# Patient Record
Sex: Female | Born: 1985 | Race: Black or African American | Hispanic: No | Marital: Single | Smoking: Never smoker
Health system: Southern US, Community
[De-identification: ages and names within clinical notes are randomized; demographics above are authoritative.]

## PROBLEM LIST (undated history)

## (undated) DIAGNOSIS — Z789 Other specified health status: Secondary | ICD-10-CM

## (undated) HISTORY — DX: Other specified health status: Z78.9

---
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
## 2011-09-19 HISTORY — PX: LEEP: SHX91

## 2015-10-01 ENCOUNTER — Encounter: Payer: Self-pay | Admitting: Obstetrics

## 2015-10-01 ENCOUNTER — Ambulatory Visit (INDEPENDENT_AMBULATORY_CARE_PROVIDER_SITE_OTHER): Payer: BLUE CROSS/BLUE SHIELD | Admitting: Obstetrics

## 2015-10-01 VITALS — BP 116/72 | HR 72 | Temp 98.7°F | Ht 66.0 in | Wt 136.0 lb

## 2015-10-01 DIAGNOSIS — Z3481 Encounter for supervision of other normal pregnancy, first trimester: Secondary | ICD-10-CM

## 2015-10-01 LAB — POCT URINALYSIS DIPSTICK
BILIRUBIN UA: NEGATIVE
Glucose, UA: NEGATIVE
KETONES UA: NEGATIVE
Leukocytes, UA: NEGATIVE
NITRITE UA: NEGATIVE
PH UA: 8
Protein, UA: NEGATIVE
RBC UA: NEGATIVE
SPEC GRAV UA: 1.01
Urobilinogen, UA: NEGATIVE

## 2015-10-01 LAB — TSH: TSH: 0.931 u[IU]/mL (ref 0.350–4.500)

## 2015-10-01 NOTE — Progress Notes (Signed)
Subjective:    Christie Delgado is being seen today for her first obstetrical visit.  This is a planned pregnancy. She is at 1730w1d gestation. Her obstetrical history is significant for none. Relationship with FOB: significant other, living together. Patient does intend to breast feed. Pregnancy history fully reviewed.  The information documented in the HPI was reviewed and verified.  Menstrual History: OB History    Gravida Para Term Preterm AB TAB SAB Ectopic Multiple Living   3 1 1  1     1        Patient's last menstrual period was 07/15/2015.    Past Medical History  Diagnosis Date  . Medical history non-contributory     Past Surgical History  Procedure Laterality Date  . Knee arthroscopy Right 2003  . Leep N/A 09/2011     (Not in a hospital admission) Allergies  Allergen Reactions  . Sulfa Antibiotics Hives    Social History  Substance Use Topics  . Smoking status: Not on file  . Smokeless tobacco: Not on file  . Alcohol Use: Not on file    History reviewed. No pertinent family history.   Review of Systems Constitutional: negative for weight loss Gastrointestinal: negative for vomiting Genitourinary:negative for genital lesions and vaginal discharge and dysuria Musculoskeletal:negative for back pain Behavioral/Psych: negative for abusive relationship, depression, illegal drug usage and tobacco use    Objective:    BP 116/72 mmHg  Pulse 72  Temp(Src) 98.7 F (37.1 C)  Ht 5\' 6"  (1.676 m)  Wt 136 lb (61.689 kg)  BMI 21.96 kg/m2  LMP 07/15/2015 General Appearance:    Alert, cooperative, no distress, appears stated age  Head:    Normocephalic, without obvious abnormality, atraumatic  Eyes:    PERRL, conjunctiva/corneas clear, EOM's intact, fundi    benign, both eyes  Ears:    Normal TM's and external ear canals, both ears  Nose:   Nares normal, septum midline, mucosa normal, no drainage    or sinus tenderness  Throat:   Lips, mucosa, and tongue normal; teeth  and gums normal  Neck:   Supple, symmetrical, trachea midline, no adenopathy;    thyroid:  no enlargement/tenderness/nodules; no carotid   bruit or JVD  Back:     Symmetric, no curvature, ROM normal, no CVA tenderness  Lungs:     Clear to auscultation bilaterally, respirations unlabored  Chest Wall:    No tenderness or deformity   Heart:    Regular rate and rhythm, S1 and S2 normal, no murmur, rub   or gallop  Breast Exam:    No tenderness, masses, or nipple abnormality  Abdomen:     Soft, non-tender, bowel sounds active all four quadrants,    no masses, no organomegaly  Genitalia:    Normal female without lesion, discharge or tenderness  Extremities:   Extremities normal, atraumatic, no cyanosis or edema  Pulses:   2+ and symmetric all extremities  Skin:   Skin color, texture, turgor normal, no rashes or lesions  Lymph nodes:   Cervical, supraclavicular, and axillary nodes normal  Neurologic:   CNII-XII intact, normal strength, sensation and reflexes    throughout      Lab Review Urine pregnancy test Labs reviewed yes Radiologic studies reviewed yes Assessment:    Pregnancy at 730w1d weeks    Plan:      Prenatal vitamins.  Counseling provided regarding continued use of seat belts, cessation of alcohol consumption, smoking or use of illicit drugs; infection precautions i.e.,  influenza/TDAP immunizations, toxoplasmosis,CMV, parvovirus, listeria and varicella; workplace safety, exercise during pregnancy; routine dental care, safe medications, sexual activity, hot tubs, saunas, pools, travel, caffeine use, fish and methlymercury, potential toxins, hair treatments, varicose veins Weight gain recommendations per IOM guidelines reviewed: underweight/BMI< 18.5--> gain 28 - 40 lbs; normal weight/BMI 18.5 - 24.9--> gain 25 - 35 lbs; overweight/BMI 25 - 29.9--> gain 15 - 25 lbs; obese/BMI >30->gain  11 - 20 lbs Problem list reviewed and updated. FIRST/CF mutation testing/NIPT/QUAD  SCREEN/fragile X/Ashkenazi Jewish population testing/Spinal muscular atrophy discussed: requested. Role of ultrasound in pregnancy discussed; fetal survey: requested. Amniocentesis discussed: not indicated. VBAC calculator score: VBAC consent form provided Meds ordered this encounter  Medications  . Prenatal Vit-Fe Fumarate-FA (PRENATAL VITAMIN) 27-0.8 MG TABS    Sig: Take by mouth.   Orders Placed This Encounter  Procedures  . Culture, OB Urine  . SureSwab, Vaginosis/Vaginitis Plus  . Obstetric panel  . HIV antibody  . Hemoglobinopathy evaluation  . Varicella zoster antibody, IgG  . VITAMIN D 25 Hydroxy (Vit-D Deficiency, Fractures)  . TSH  . POCT urinalysis dipstick    Follow up in 4 weeks.

## 2015-10-02 LAB — OBSTETRIC PANEL
Antibody Screen: NEGATIVE
BASOS ABS: 0 10*3/uL (ref 0.0–0.1)
BASOS PCT: 0 % (ref 0–1)
Eosinophils Absolute: 0.1 10*3/uL (ref 0.0–0.7)
Eosinophils Relative: 2 % (ref 0–5)
HEMATOCRIT: 34.6 % — AB (ref 36.0–46.0)
HEP B S AG: NEGATIVE
Hemoglobin: 11 g/dL — ABNORMAL LOW (ref 12.0–15.0)
LYMPHS PCT: 20 % (ref 12–46)
Lymphs Abs: 1.2 10*3/uL (ref 0.7–4.0)
MCH: 26.4 pg (ref 26.0–34.0)
MCHC: 31.8 g/dL (ref 30.0–36.0)
MCV: 83 fL (ref 78.0–100.0)
MONO ABS: 0.4 10*3/uL (ref 0.1–1.0)
MPV: 11.4 fL (ref 8.6–12.4)
Monocytes Relative: 7 % (ref 3–12)
NEUTROS ABS: 4.2 10*3/uL (ref 1.7–7.7)
NEUTROS PCT: 71 % (ref 43–77)
Platelets: 245 10*3/uL (ref 150–400)
RBC: 4.17 MIL/uL (ref 3.87–5.11)
RDW: 15.8 % — ABNORMAL HIGH (ref 11.5–15.5)
RUBELLA: 1.12 {index} — AB (ref <0.90)
Rh Type: POSITIVE
WBC: 5.9 10*3/uL (ref 4.0–10.5)

## 2015-10-02 LAB — HIV ANTIBODY (ROUTINE TESTING W REFLEX): HIV 1&2 Ab, 4th Generation: NONREACTIVE

## 2015-10-02 LAB — VITAMIN D 25 HYDROXY (VIT D DEFICIENCY, FRACTURES): VIT D 25 HYDROXY: 13 ng/mL — AB (ref 30–100)

## 2015-10-02 LAB — VARICELLA ZOSTER ANTIBODY, IGG: Varicella IgG: 1799 Index — ABNORMAL HIGH (ref <135.00)

## 2015-10-03 LAB — HEMOGLOBINOPATHY EVALUATION
HEMOGLOBIN OTHER: 0 %
HGB A: 97.5 % (ref 96.8–97.8)
Hgb A2 Quant: 2.5 % (ref 2.2–3.2)
Hgb F Quant: 0 % (ref 0.0–2.0)
Hgb S Quant: 0 %

## 2015-10-03 LAB — CULTURE, OB URINE
COLONY COUNT: NO GROWTH
ORGANISM ID, BACTERIA: NO GROWTH

## 2015-10-05 ENCOUNTER — Other Ambulatory Visit: Payer: Self-pay | Admitting: Obstetrics

## 2015-10-05 DIAGNOSIS — B9689 Other specified bacterial agents as the cause of diseases classified elsewhere: Secondary | ICD-10-CM

## 2015-10-05 DIAGNOSIS — N760 Acute vaginitis: Principal | ICD-10-CM

## 2015-10-05 LAB — SURESWAB, VAGINOSIS/VAGINITIS PLUS
ATOPOBIUM VAGINAE: 7.5 Log (cells/mL)
C. albicans, DNA: NOT DETECTED
C. glabrata, DNA: NOT DETECTED
C. parapsilosis, DNA: NOT DETECTED
C. trachomatis RNA, TMA: NOT DETECTED
C. tropicalis, DNA: NOT DETECTED
LACTOBACILLUS SPECIES: NOT DETECTED Log (cells/mL)
N. gonorrhoeae RNA, TMA: NOT DETECTED
T. VAGINALIS RNA, QL TMA: NOT DETECTED

## 2015-10-05 MED ORDER — METRONIDAZOLE 500 MG PO TABS: 500.0000 mg | ORAL_TABLET | Freq: Two times a day (BID) | ORAL | Status: DC

## 2015-10-28 ENCOUNTER — Encounter: Payer: BLUE CROSS/BLUE SHIELD | Admitting: Obstetrics

## 2015-10-31 ENCOUNTER — Ambulatory Visit (INDEPENDENT_AMBULATORY_CARE_PROVIDER_SITE_OTHER): Payer: BLUE CROSS/BLUE SHIELD | Admitting: Obstetrics

## 2015-10-31 ENCOUNTER — Encounter: Payer: Self-pay | Admitting: Obstetrics

## 2015-10-31 VITALS — BP 115/76 | HR 88 | Temp 98.6°F | Wt 141.0 lb

## 2015-10-31 DIAGNOSIS — Z3482 Encounter for supervision of other normal pregnancy, second trimester: Secondary | ICD-10-CM

## 2015-10-31 LAB — POCT URINALYSIS DIPSTICK
BILIRUBIN UA: NEGATIVE
GLUCOSE UA: NEGATIVE
KETONES UA: NEGATIVE
Leukocytes, UA: NEGATIVE
Nitrite, UA: NEGATIVE
Protein, UA: NEGATIVE
RBC UA: NEGATIVE
Urobilinogen, UA: NEGATIVE
pH, UA: 8

## 2015-10-31 NOTE — Progress Notes (Signed)
  Subjective:    Christie GoldsmithRachel Delgado is a 30 y.o. female being seen today for her obstetrical visit. She is at 9947w3d gestation. Patient reports: no complaints.  Problem List Items Addressed This Visit    None    Visit Diagnoses    Supervision of other normal pregnancy, antepartum, second trimester    -  Primary    Relevant Orders    POCT urinalysis dipstick (Completed)    US OB Comp + 14 Wk    AFP, Quad Screen    AFP, Quad Screen      There are no active problems to display for this patient.   Objective:     BP 115/76 mmHg  Pulse 88  Temp(Src) 98.6 F (37 C)  Wt 141 lb (63.957 kg)  LMP 07/15/2015 Uterine Size: Below umbilicus     Assessment:    Pregnancy @ 5647w3d  weeks Doing well    Plan:    Problem list reviewed and updated. Labs reviewed.  Follow up in 4 weeks. FIRST/CF mutation testing/NIPT/QUAD SCREEN/fragile X/Ashkenazi Jewish population testing/Spinal muscular atrophy discussed: requested. Role of ultrasound in pregnancy discussed; fetal survey: requested. Amniocentesis discussed: not indicated.

## 2015-11-01 LAB — AFP, QUAD SCREEN
AFP: 36.2 ng/mL
Age Alone: 1:663 {titer}
Curr Gest Age: 15.3 wks.days
Down Syndrome Scr Risk Est: 1:38500 {titer}
HCG, Total: 20.2 IU/mL
INH: 113.6 pg/mL
Interpretation-AFP: NEGATIVE
MOM FOR HCG: 0.39
MoM for AFP: 0.98
MoM for INH: 0.61
OPEN SPINA BIFIDA: NEGATIVE
TRI 18 SCR RISK EST: NEGATIVE
UE3 VALUE: 0.84 ng/mL
uE3 Mom: 1.16

## 2015-11-06 ENCOUNTER — Other Ambulatory Visit: Payer: Self-pay | Admitting: Certified Nurse Midwife

## 2015-11-28 ENCOUNTER — Ambulatory Visit (INDEPENDENT_AMBULATORY_CARE_PROVIDER_SITE_OTHER): Payer: BLUE CROSS/BLUE SHIELD

## 2015-11-28 ENCOUNTER — Ambulatory Visit (INDEPENDENT_AMBULATORY_CARE_PROVIDER_SITE_OTHER): Payer: BLUE CROSS/BLUE SHIELD | Admitting: Obstetrics

## 2015-11-28 ENCOUNTER — Encounter: Payer: Self-pay | Admitting: Obstetrics

## 2015-11-28 VITALS — BP 115/82 | HR 73 | Temp 98.7°F | Wt 144.0 lb

## 2015-11-28 DIAGNOSIS — Z3482 Encounter for supervision of other normal pregnancy, second trimester: Secondary | ICD-10-CM

## 2015-11-28 DIAGNOSIS — Z36 Encounter for antenatal screening of mother: Secondary | ICD-10-CM

## 2015-11-28 LAB — POCT URINALYSIS DIPSTICK
BILIRUBIN UA: NEGATIVE
Blood, UA: NEGATIVE
GLUCOSE UA: NEGATIVE
KETONES UA: NEGATIVE
Nitrite, UA: NEGATIVE
PH UA: 8.5
Protein, UA: NEGATIVE
Spec Grav, UA: <=1.005
Urobilinogen, UA: NEGATIVE

## 2015-11-28 NOTE — Progress Notes (Signed)
Subjective:    Christie Delgado is a 30 y.o. female being seen today for her obstetrical visit. She is at [redacted]w[redacted]d gestation. Patient reports: no complaints . Fetal movement: normal.  Problem List Items Addressed This Visit    None    Visit Diagnoses    Supervision of other normal pregnancy, antepartum, second trimester    -  Primary    Relevant Orders    POCT urinalysis dipstick    [place] MFM OB COMP + 14 WK      There are no active problems to display for this patient.  Objective:    BP 115/82 mmHg  Pulse 73  Temp(Src) 98.7 F (37.1 C)  Wt 144 lb (65.318 kg)  LMP 07/15/2015 FHT: 150 BPM  Uterine Size: size equals dates     Assessment:    Pregnancy @ [redacted]w[redacted]d    Plan:    Signs and symptoms of preterm labor: discussed.  Labs, problem list reviewed and updated 2 hr GTT planned Follow up in 4 weeks.

## 2015-11-28 NOTE — Addendum Note (Signed)
Addended by: Marya Landry D on: 11/28/2015 01:13 PM   Modules accepted: Orders

## 2015-11-29 ENCOUNTER — Other Ambulatory Visit: Payer: Self-pay | Admitting: Obstetrics

## 2015-11-29 ENCOUNTER — Ambulatory Visit (HOSPITAL_COMMUNITY)
Admission: RE | Admit: 2015-11-29 | Discharge: 2015-11-29 | Disposition: A | Discharge status: Home / Self Care | Payer: BLUE CROSS/BLUE SHIELD | Source: Ambulatory Visit | Attending: Obstetrics | Admitting: Obstetrics

## 2015-11-29 ENCOUNTER — Encounter (HOSPITAL_COMMUNITY): Payer: Self-pay

## 2015-11-29 DIAGNOSIS — O35BXX0 Maternal care for other (suspected) fetal abnormality and damage, fetal cardiac anomalies, not applicable or unspecified: Secondary | ICD-10-CM

## 2015-11-29 DIAGNOSIS — Z3A19 19 weeks gestation of pregnancy: Secondary | ICD-10-CM

## 2015-11-29 DIAGNOSIS — O359XX0 Maternal care for (suspected) fetal abnormality and damage, unspecified, not applicable or unspecified: Secondary | ICD-10-CM

## 2015-11-29 DIAGNOSIS — O358XX0 Maternal care for other (suspected) fetal abnormality and damage, not applicable or unspecified: Secondary | ICD-10-CM | POA: Insufficient documentation

## 2015-11-29 DIAGNOSIS — Z36 Encounter for antenatal screening of mother: Secondary | ICD-10-CM | POA: Insufficient documentation

## 2015-11-29 DIAGNOSIS — Z3689 Encounter for other specified antenatal screening: Secondary | ICD-10-CM

## 2015-11-29 DIAGNOSIS — Z315 Encounter for genetic counseling: Secondary | ICD-10-CM | POA: Diagnosis present

## 2015-11-29 DIAGNOSIS — Z3482 Encounter for supervision of other normal pregnancy, second trimester: Secondary | ICD-10-CM

## 2015-11-29 NOTE — Progress Notes (Signed)
Genetic Counseling  High-Risk Gestation Note  Appointment Date:  11/29/2015 Referred By: Shelly Bombard, MD Date of Birth:  Sep 19, 1986   Pregnancy History: [id number] Estimated Date of Delivery: 04/20/16 Estimated Gestational Age: 56w4dAttending: MRenella Cunas MD  I met with [name] her partner for genetic counseling because of abnormal ultrasound finding.  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  Discussed various etiologies for congenital heart disease including isolated, multifactorial, environmental, and chromosomal/genetic  Regarding chromosome conditions, tetralogy of fallot associated with approximately 45% chance for chromosome condition  Quad screen within normal limits for T21 and T18  Patient declined NIPS (with 22q) today; Couple understandably upset and indicated too overwhelmed to decide about genetic testing today  Declined amniocentesis given associated risk for complications   [facility]  Follow-up ultrasound scheduled for 12/27/15  We began by reviewing the ultrasound in detail. Detailed ultrasound performed today visualized fetal heart defect, suggestive of tetralogy of fallot. Remaining visualized fetal anatomy appeared normal. Complete ultrasound results under separate cover. Follow-up ultrasound is scheduled for 12/27/15. [facility]   This couple was counseled that congenital heart defects occur at a frequency of approximately 1 in 100 births.  Tetralogy of fallot is a congenital heart defect in which there are four components: right ventricular outflow tract obstruction, overriding aorta, ventricular septal defect (VSD), and right ventricular hypertrophy. Tetralogy of fallot is the most common cyanotic congenital heart disease, accounting for  approximately 5-10% of congenital heart disease of the newborn.     We discussed that congenital heart defects can be genetic, environmental, or multifactorial in etiology.  This couple was counseled that congenital heart defects are prominent features in chromosome conditions, particularly autosomal trisomies and microdeletions.  Approximately 15-20% of congenital heart defects result from an underlying chromosome condition, and specifically tetralogy of fallot is associated with an approximate 45% risk for an underlying chromosome condition.    We reviewed chromosomes, nondisjunction, and the common features of Down syndrome, trisomies 13/18, and 22q11 deletion syndrome. It is estimated that 8-23% of patients with tetralogy of fallot have 22q11 deletion syndrome. In addition, single gene conditions are also a common genetic cause of congenital heart defects.  To date, more than 500 single gene disorders are known to have congenital heart disease as a described feature. Tetralogy of fallot has been described as a feature of phenylketonuria, Alagille syndrome, VACTERL, and CHARGE syndrome.    Environmental causes for congenital heart defects are well described and many teratogens are known to be linked to congenital heart disease (alcohol and specific medications).  Additionally, we discussed that the incidence of congenital heart defects is increased among individuals who have insulin dependent diabetes. Ms. JCraveyreported no known teratogenic exposures during the pregnancy. The remainder of the cases of congenital heart disease are classified as multifactorial, referring to the condition being caused by a combination of both environmental and genetic causes.    We reviewed the results of Christie Delgado Quad screening, previously performed through her OB office, which indicated decreased risks for fetal Down syndrome (1 in 38,500) and trisomy 18 (1 in 86. We discussed that screening tests are used to modify  a patient's a priori risk for aneuploidy, typically based on age.  This estimate provides a pregnancy specific risk assessment and is not considered to be diagnostic. This couple was counseled regarding  the availability of noninvasive prenatal screening (NIPS)/cell free DNA testing.  We reviewed that this technology screens for specific fetal aneuploidy (trisomies 13, 18, 21 and Turner syndrome) and 22q11 deletion syndrome.  We reviewed the detection and false positive rates for each condition. NIPS does not assess for all chromosome conditions and does not assess for single gene conditions.  In addition, we reviewed the option of amniocentesis including the associated risks, benefits, and limitations.  She was counseled that chromosome analysis can be performed both prenatally (amniocentesis) and postnatally (cord blood or peripheral blood).  Christie Delgado declined amniocentesis given the associated risk for complications. The couple also declined NIPS at the time of today's visit. They were understandably upset by today's ultrasound findings and stated that they were still processing this information and wanted to have more time to further consider genetic screening/testing options.   We discussed that the postnatal prognosis is somewhat dependent upon the underlying etiology. We reviewed options for the pregnancy including termination of pregnancy, follow-up ultrasounds and fetal echocardiogram and postnatal genetics evaluation. Follow up ultrasound and fetal echocardiogram were recommended and scheduled.   Given the nature of today's discussion, formal family histories were not reviewed at this time. The couple reported no known relatives with birth defects, including congenital heart disease and no known genetic conditions in the family. Without further information regarding the provided family history, an accurate genetic risk cannot be calculated. Further genetic counseling is warranted if more information is  obtained.  I counseled this couple regarding the above risks and available options.  The approximate face-to-face time with the genetic counselor was 30 minutes.  Chipper Oman, MS Certified Genetic Counselor 11/29/2015

## 2015-12-02 ENCOUNTER — Telehealth: Payer: Self-pay | Admitting: *Deleted

## 2015-12-02 ENCOUNTER — Other Ambulatory Visit (HOSPITAL_COMMUNITY): Payer: Self-pay | Admitting: *Deleted

## 2015-12-02 DIAGNOSIS — O359XX0 Maternal care for (suspected) fetal abnormality and damage, unspecified, not applicable or unspecified: Secondary | ICD-10-CM

## 2015-12-02 LAB — SURESWAB, VAGINOSIS/VAGINITIS PLUS
Atopobium vaginae: NOT DETECTED Log (cells/mL)
C. GLABRATA, DNA: NOT DETECTED
C. PARAPSILOSIS, DNA: NOT DETECTED
C. TROPICALIS, DNA: NOT DETECTED
C. albicans, DNA: DETECTED — AB
C. trachomatis RNA, TMA: NOT DETECTED
LACTOBACILLUS SPECIES: NOT DETECTED Log (cells/mL)
MEGASPHAERA SPECIES: NOT DETECTED Log (cells/mL)
N. GONORRHOEAE RNA, TMA: NOT DETECTED
T. vaginalis RNA, QL TMA: NOT DETECTED

## 2015-12-02 NOTE — Telephone Encounter (Signed)
Patient reports she was supposed to have medications at the pharmacy and they are not there.

## 2015-12-03 ENCOUNTER — Other Ambulatory Visit: Payer: Self-pay | Admitting: Obstetrics

## 2015-12-03 DIAGNOSIS — N760 Acute vaginitis: Principal | ICD-10-CM

## 2015-12-03 DIAGNOSIS — B9689 Other specified bacterial agents as the cause of diseases classified elsewhere: Secondary | ICD-10-CM

## 2015-12-03 MED ORDER — TINIDAZOLE 500 MG PO TABS: 1000.0000 mg | ORAL_TABLET | Freq: Every day | ORAL | Status: DC

## 2015-12-26 ENCOUNTER — Ambulatory Visit (INDEPENDENT_AMBULATORY_CARE_PROVIDER_SITE_OTHER): Payer: BLUE CROSS/BLUE SHIELD | Admitting: Obstetrics

## 2015-12-26 ENCOUNTER — Other Ambulatory Visit: Payer: Self-pay

## 2015-12-26 VITALS — BP 107/71 | HR 85 | Temp 99.0°F | Wt 146.0 lb

## 2015-12-26 DIAGNOSIS — J301 Allergic rhinitis due to pollen: Secondary | ICD-10-CM

## 2015-12-26 DIAGNOSIS — J069 Acute upper respiratory infection, unspecified: Secondary | ICD-10-CM | POA: Diagnosis not present

## 2015-12-26 DIAGNOSIS — Z3482 Encounter for supervision of other normal pregnancy, second trimester: Secondary | ICD-10-CM

## 2015-12-26 DIAGNOSIS — Z331 Pregnant state, incidental: Secondary | ICD-10-CM | POA: Diagnosis not present

## 2015-12-26 DIAGNOSIS — Z1389 Encounter for screening for other disorder: Secondary | ICD-10-CM | POA: Diagnosis not present

## 2015-12-26 DIAGNOSIS — O2342 Unspecified infection of urinary tract in pregnancy, second trimester: Secondary | ICD-10-CM

## 2015-12-26 LAB — POCT URINALYSIS DIPSTICK
Bilirubin, UA: NEGATIVE
Blood, UA: 50
GLUCOSE UA: NEGATIVE
Ketones, UA: NEGATIVE
NITRITE UA: NEGATIVE
PROTEIN UA: NEGATIVE
SPEC GRAV UA: 1.015
UROBILINOGEN UA: NEGATIVE
pH, UA: 6

## 2015-12-26 MED ORDER — LORATADINE 10 MG PO TABS: 10.0000 mg | ORAL_TABLET | Freq: Every day | ORAL | Status: DC

## 2015-12-26 MED ORDER — CEFUROXIME AXETIL 500 MG PO TABS: 500.0000 mg | ORAL_TABLET | Freq: Two times a day (BID) | ORAL | Status: DC

## 2015-12-27 ENCOUNTER — Other Ambulatory Visit (HOSPITAL_COMMUNITY): Payer: Self-pay | Admitting: Maternal and Fetal Medicine

## 2015-12-27 ENCOUNTER — Ambulatory Visit (HOSPITAL_COMMUNITY)
Admission: RE | Admit: 2015-12-27 | Discharge: 2015-12-27 | Disposition: A | Discharge status: Home / Self Care | Payer: BLUE CROSS/BLUE SHIELD | Source: Ambulatory Visit | Attending: Obstetrics | Admitting: Obstetrics

## 2015-12-27 ENCOUNTER — Encounter: Payer: Self-pay | Admitting: Obstetrics

## 2015-12-27 ENCOUNTER — Encounter (HOSPITAL_COMMUNITY): Payer: Self-pay

## 2015-12-27 VITALS — BP 111/67 | HR 79 | Wt 146.0 lb

## 2015-12-27 DIAGNOSIS — O359XX0 Maternal care for (suspected) fetal abnormality and damage, unspecified, not applicable or unspecified: Secondary | ICD-10-CM

## 2015-12-27 DIAGNOSIS — Z3A23 23 weeks gestation of pregnancy: Secondary | ICD-10-CM | POA: Diagnosis not present

## 2015-12-27 DIAGNOSIS — O358XX0 Maternal care for other (suspected) fetal abnormality and damage, not applicable or unspecified: Secondary | ICD-10-CM | POA: Diagnosis not present

## 2015-12-27 DIAGNOSIS — O35BXX0 Maternal care for other (suspected) fetal abnormality and damage, fetal cardiac anomalies, not applicable or unspecified: Secondary | ICD-10-CM

## 2015-12-27 LAB — CULTURE, OB URINE

## 2015-12-27 NOTE — Progress Notes (Signed)
  Subjective:    Christie GoldsmithRachel Delgado is a 30 y.o. female being seen today for her obstetrical visit. She is at 322w4d gestation. Patient reports: burning with urination, cough, runny nose and congestion    Fetal movement: normal.  Problem List Items Addressed This Visit    None    Visit Diagnoses    Supervision of other normal pregnancy, antepartum, second trimester    -  Primary    Relevant Orders    POCT urinalysis dipstick (Completed)    UTI (urinary tract infection) during pregnancy, second trimester        Relevant Medications    cefUROXime (CEFTIN) 500 MG tablet    Other Relevant Orders    Culture, OB Urine    URI (upper respiratory infection)        Relevant Medications    cefUROXime (CEFTIN) 500 MG tablet    Rhinitis due to pollen        Relevant Medications    loratadine (CLARITIN) 10 MG tablet      Patient Active Problem List   Diagnosis Date Noted  . Pregnancy complicated by fetal congenital heart disease 11/29/2015   Objective:    BP 107/71 mmHg  Pulse 85  Temp(Src) 99 F (37.2 C)  Wt 146 lb (66.225 kg)  LMP 07/15/2015 FHT: 150 BPM  Uterine Size: size equals dates     Assessment:    Pregnancy @ 6722w4d     UTI  URI  Plan:    Ceftin Rx  Claritin Rx   OBGCT: ordered for next visit.  Labs, problem list reviewed and updated 2 hr GTT planned Follow up in 4 weeks.

## 2016-01-10 ENCOUNTER — Encounter (HOSPITAL_COMMUNITY): Payer: Self-pay

## 2016-01-17 ENCOUNTER — Encounter: Payer: Self-pay | Admitting: *Deleted

## 2016-01-21 ENCOUNTER — Other Ambulatory Visit: Payer: Self-pay | Admitting: Certified Nurse Midwife

## 2016-01-23 ENCOUNTER — Encounter: Payer: Self-pay | Admitting: Obstetrics

## 2016-01-23 ENCOUNTER — Encounter (HOSPITAL_COMMUNITY): Payer: Self-pay

## 2016-01-23 ENCOUNTER — Other Ambulatory Visit (INDEPENDENT_AMBULATORY_CARE_PROVIDER_SITE_OTHER): Payer: BLUE CROSS/BLUE SHIELD

## 2016-01-23 ENCOUNTER — Ambulatory Visit (INDEPENDENT_AMBULATORY_CARE_PROVIDER_SITE_OTHER): Payer: BLUE CROSS/BLUE SHIELD | Admitting: Obstetrics

## 2016-01-23 ENCOUNTER — Ambulatory Visit (HOSPITAL_COMMUNITY)
Admission: RE | Admit: 2016-01-23 | Discharge: 2016-01-23 | Disposition: A | Discharge status: Home / Self Care | Payer: BLUE CROSS/BLUE SHIELD | Source: Ambulatory Visit | Attending: Obstetrics and Gynecology | Admitting: Obstetrics and Gynecology

## 2016-01-23 ENCOUNTER — Ambulatory Visit (HOSPITAL_COMMUNITY): Payer: BLUE CROSS/BLUE SHIELD

## 2016-01-23 VITALS — BP 109/68 | HR 78 | Temp 98.7°F | Wt 149.0 lb

## 2016-01-23 DIAGNOSIS — O358XX0 Maternal care for other (suspected) fetal abnormality and damage, not applicable or unspecified: Secondary | ICD-10-CM

## 2016-01-23 DIAGNOSIS — O35BXX0 Maternal care for other (suspected) fetal abnormality and damage, fetal cardiac anomalies, not applicable or unspecified: Secondary | ICD-10-CM

## 2016-01-23 DIAGNOSIS — Z3A27 27 weeks gestation of pregnancy: Secondary | ICD-10-CM | POA: Diagnosis not present

## 2016-01-23 DIAGNOSIS — Z3493 Encounter for supervision of normal pregnancy, unspecified, third trimester: Secondary | ICD-10-CM

## 2016-01-23 LAB — POCT URINALYSIS DIPSTICK
Bilirubin, UA: NEGATIVE
GLUCOSE UA: NEGATIVE
KETONES UA: NEGATIVE
Nitrite, UA: NEGATIVE
PROTEIN UA: NEGATIVE
RBC UA: NEGATIVE
Urobilinogen, UA: NEGATIVE
pH, UA: 8

## 2016-01-23 NOTE — Progress Notes (Signed)
Subjective:    Christie GoldsmithRachel Delgado is a 30 y.o. female being seen today for her obstetrical visit. She is at 321w3d gestation. Patient reports: no complaints . Fetal movement: normal.  Problem List Items Addressed This Visit    None    Visit Diagnoses    Prenatal care, second trimester    -  Primary    Relevant Orders    POCT urinalysis dipstick (Completed)    Glucose Tolerance, 2 Hours w/1 Hour    CBC    HIV antibody    RPR      Patient Active Problem List   Diagnosis Date Noted  . Pregnancy complicated by fetal congenital heart disease 11/29/2015   Objective:    BP 109/68 mmHg  Pulse 78  Temp(Src) 98.7 F (37.1 C)  Wt 149 lb (67.586 kg)  LMP 07/15/2015 FHT: 150 BPM  Uterine Size: size equals dates     Assessment:    Pregnancy @ 4921w3d    Plan:    OBGCT: ordered. Signs and symptoms of preterm labor: discussed.  Labs, problem list reviewed and updated 2 hr GTT planned Follow up in 2 weeks.

## 2016-01-23 NOTE — Progress Notes (Signed)
Patient has no concerns 

## 2016-01-24 ENCOUNTER — Other Ambulatory Visit (HOSPITAL_COMMUNITY): Payer: Self-pay | Admitting: *Deleted

## 2016-01-24 DIAGNOSIS — IMO0002 Reserved for concepts with insufficient information to code with codable children: Secondary | ICD-10-CM

## 2016-01-24 LAB — CBC
Hematocrit: 31.4 % — ABNORMAL LOW (ref 34.0–46.6)
Hemoglobin: 10 g/dL — ABNORMAL LOW (ref 11.1–15.9)
MCH: 27.1 pg (ref 26.6–33.0)
MCHC: 31.8 g/dL (ref 31.5–35.7)
MCV: 85 fL (ref 79–97)
PLATELETS: 235 10*3/uL (ref 150–379)
RBC: 3.69 x10E6/uL — AB (ref 3.77–5.28)
RDW: 15.9 % — ABNORMAL HIGH (ref 12.3–15.4)
WBC: 6.9 10*3/uL (ref 3.4–10.8)

## 2016-01-24 LAB — GLUCOSE TOLERANCE, 2 HOURS W/ 1HR
GLUCOSE, FASTING: 69 mg/dL (ref 65–91)
Glucose, 1 hour: 56 mg/dL — ABNORMAL LOW (ref 65–179)
Glucose, 2 hour: 70 mg/dL (ref 65–152)

## 2016-01-24 LAB — RPR: RPR Ser Ql: NONREACTIVE

## 2016-01-24 LAB — HIV ANTIBODY (ROUTINE TESTING W REFLEX): HIV SCREEN 4TH GENERATION: NONREACTIVE

## 2016-02-06 ENCOUNTER — Ambulatory Visit (INDEPENDENT_AMBULATORY_CARE_PROVIDER_SITE_OTHER): Payer: BLUE CROSS/BLUE SHIELD | Admitting: Obstetrics

## 2016-02-06 ENCOUNTER — Encounter: Payer: Self-pay | Admitting: Obstetrics

## 2016-02-06 VITALS — BP 112/71 | HR 76 | Wt 151.0 lb

## 2016-02-06 DIAGNOSIS — Z3483 Encounter for supervision of other normal pregnancy, third trimester: Secondary | ICD-10-CM

## 2016-02-06 LAB — POCT URINALYSIS DIPSTICK
BILIRUBIN UA: NEGATIVE
Glucose, UA: NEGATIVE
NITRITE UA: NEGATIVE
PH UA: 7.5
PROTEIN UA: NEGATIVE
RBC UA: NEGATIVE
Spec Grav, UA: <=1.005
UROBILINOGEN UA: NEGATIVE

## 2016-02-06 NOTE — Progress Notes (Signed)
Subjective:    Randon GoldsmithRachel Tussey is a 30 y.o. female being seen today for her obstetrical visit. She is at 418w3d gestation. Patient reports no complaints. Fetal movement: normal.  Problem List Items Addressed This Visit    None    Visit Diagnoses    Encounter for supervision of other normal pregnancy in third trimester    -  Primary    Relevant Orders    POCT urinalysis dipstick (Completed)      Patient Active Problem List   Diagnosis Date Noted  . Pregnancy complicated by fetal congenital heart disease 11/29/2015   Objective:    BP 112/71 mmHg  Pulse 76  Wt 151 lb (68.493 kg)  LMP 07/15/2015 FHT:  150 BPM  Uterine Size: size equals dates  Presentation: unsure     Assessment:    Pregnancy @ 468w3d weeks   Plan:     labs reviewed, problem list updated Consent signed. GBS sent TDAP offered  Rhogam given for RH negative Pediatrician: discussed. Infant feeding: plans to breastfeed. Maternity leave: discussed. Cigarette smoking: never smoked. Orders Placed This Encounter  Procedures  . POCT urinalysis dipstick   No orders of the defined types were placed in this encounter.   Follow up in 2 Weeks.

## 2016-02-20 ENCOUNTER — Encounter: Payer: Self-pay | Admitting: Obstetrics

## 2016-02-20 ENCOUNTER — Ambulatory Visit (HOSPITAL_COMMUNITY)
Admission: RE | Admit: 2016-02-20 | Discharge: 2016-02-20 | Disposition: A | Discharge status: Home / Self Care | Payer: BLUE CROSS/BLUE SHIELD | Source: Ambulatory Visit | Attending: Obstetrics | Admitting: Obstetrics

## 2016-02-20 ENCOUNTER — Other Ambulatory Visit (HOSPITAL_COMMUNITY): Payer: Self-pay | Admitting: Maternal and Fetal Medicine

## 2016-02-20 ENCOUNTER — Encounter (HOSPITAL_COMMUNITY): Payer: Self-pay

## 2016-02-20 ENCOUNTER — Ambulatory Visit (INDEPENDENT_AMBULATORY_CARE_PROVIDER_SITE_OTHER): Payer: BLUE CROSS/BLUE SHIELD | Admitting: Obstetrics

## 2016-02-20 VITALS — BP 129/70 | HR 82 | Wt 155.0 lb

## 2016-02-20 VITALS — BP 113/74 | HR 74 | Wt 154.0 lb

## 2016-02-20 DIAGNOSIS — IMO0002 Reserved for concepts with insufficient information to code with codable children: Secondary | ICD-10-CM

## 2016-02-20 DIAGNOSIS — O359XX0 Maternal care for (suspected) fetal abnormality and damage, unspecified, not applicable or unspecified: Secondary | ICD-10-CM

## 2016-02-20 DIAGNOSIS — O35BXX0 Maternal care for other (suspected) fetal abnormality and damage, fetal cardiac anomalies, not applicable or unspecified: Secondary | ICD-10-CM

## 2016-02-20 DIAGNOSIS — Z3A31 31 weeks gestation of pregnancy: Secondary | ICD-10-CM

## 2016-02-20 DIAGNOSIS — Z3493 Encounter for supervision of normal pregnancy, unspecified, third trimester: Secondary | ICD-10-CM | POA: Diagnosis not present

## 2016-02-20 DIAGNOSIS — O358XX0 Maternal care for other (suspected) fetal abnormality and damage, not applicable or unspecified: Secondary | ICD-10-CM | POA: Diagnosis present

## 2016-02-20 LAB — POCT URINALYSIS DIPSTICK
Bilirubin, UA: NEGATIVE
GLUCOSE UA: NEGATIVE
KETONES UA: NEGATIVE
Leukocytes, UA: NEGATIVE
Nitrite, UA: NEGATIVE
Protein, UA: NEGATIVE
RBC UA: NEGATIVE
Urobilinogen, UA: NEGATIVE
pH, UA: 7

## 2016-02-20 NOTE — Progress Notes (Signed)
Patient has no questions or concerns today.

## 2016-02-20 NOTE — Progress Notes (Signed)
Subjective:    Christie GoldsmithRachel Delgado is a 30 y.o. female being seen today for her obstetrical visit. She is at 5030w3d gestation. Patient reports no complaints. Fetal movement: normal.  Problem List Items Addressed This Visit    None    Visit Diagnoses    Prenatal care, third trimester    -  Primary    Relevant Orders    POCT urinalysis dipstick (Completed)      Patient Active Problem List   Diagnosis Date Noted  . Pregnancy complicated by fetal congenital heart disease 11/29/2015   Objective:    BP 113/74 mmHg  Pulse 74  Wt 154 lb (69.854 kg)  LMP 07/15/2015 FHT:  150 BPM  Uterine Size: size equals dates  Presentation: unsure     Assessment:    Pregnancy @ 2730w3d weeks   Plan:     labs reviewed, problem list updated Consent signed. GBS sent TDAP offered  Rhogam given for RH negative Pediatrician: discussed. Infant feeding: plans to breastfeed. Maternity leave: discussed. Cigarette smoking: never smoked. Orders Placed This Encounter  Procedures  . POCT urinalysis dipstick   No orders of the defined types were placed in this encounter.   Follow up in 2 Weeks.

## 2016-03-05 ENCOUNTER — Ambulatory Visit (INDEPENDENT_AMBULATORY_CARE_PROVIDER_SITE_OTHER): Payer: BLUE CROSS/BLUE SHIELD | Admitting: Certified Nurse Midwife

## 2016-03-05 VITALS — BP 118/76 | HR 83 | Wt 156.0 lb

## 2016-03-05 DIAGNOSIS — O0993 Supervision of high risk pregnancy, unspecified, third trimester: Secondary | ICD-10-CM

## 2016-03-05 LAB — POCT URINALYSIS DIPSTICK
BILIRUBIN UA: NEGATIVE
Blood, UA: NEGATIVE
GLUCOSE UA: NEGATIVE
KETONES UA: NEGATIVE
LEUKOCYTES UA: NEGATIVE
NITRITE UA: NEGATIVE
Protein, UA: NEGATIVE
Spec Grav, UA: <=1.005
Urobilinogen, UA: NEGATIVE
pH, UA: 7

## 2016-03-05 NOTE — Addendum Note (Signed)
Addended by: Marya LandryFOSTER, Ethan Kasperski D on: 03/05/2016 01:59 PM   Modules accepted: Orders

## 2016-03-05 NOTE — Progress Notes (Signed)
Subjective:    Christie GoldsmithRachel Delgado is a 30 y.o. female being seen today for her obstetrical visit. She is at 6422w3d gestation. Patient reports no complaints. Fetal movement: normal.  Problem List Items Addressed This Visit    None     Patient Active Problem List   Diagnosis Date Noted  . Pregnancy complicated by fetal congenital heart disease 11/29/2015   Objective:    BP 118/76 mmHg  Pulse 83  Wt 156 lb (70.761 kg)  LMP 07/15/2015 FHT:  145 BPM  Uterine Size: size equals dates  Presentation: cephalic     Assessment:    Pregnancy @ 2622w3d weeks   suspected tetrology of fallot[facility]  Plan:     labs reviewed, problem list updated Consent signed. GBS planning TDAP offered  Rhogam given for RH negative Pediatrician: discussed. Infant feeding: plans to breastfeed. Maternity leave: discussed. Cigarette smoking: never smoked. No orders of the defined types were placed in this encounter.   No orders of the defined types were placed in this encounter.   Follow up in 1 week [facility].

## 2016-03-13 ENCOUNTER — Encounter: Payer: Self-pay | Admitting: Obstetrics

## 2016-03-23 ENCOUNTER — Other Ambulatory Visit (HOSPITAL_COMMUNITY): Payer: Self-pay | Admitting: Maternal and Fetal Medicine

## 2016-03-23 ENCOUNTER — Ambulatory Visit (HOSPITAL_COMMUNITY)
Admission: RE | Admit: 2016-03-23 | Discharge: 2016-03-23 | Disposition: A | Discharge status: Home / Self Care | Payer: BLUE CROSS/BLUE SHIELD | Source: Ambulatory Visit | Attending: Maternal and Fetal Medicine | Admitting: Maternal and Fetal Medicine

## 2016-03-23 ENCOUNTER — Encounter (HOSPITAL_COMMUNITY): Payer: Self-pay

## 2016-03-23 DIAGNOSIS — O35BXX0 Maternal care for other (suspected) fetal abnormality and damage, fetal cardiac anomalies, not applicable or unspecified: Secondary | ICD-10-CM

## 2016-03-23 DIAGNOSIS — O358XX0 Maternal care for other (suspected) fetal abnormality and damage, not applicable or unspecified: Secondary | ICD-10-CM | POA: Insufficient documentation

## 2016-03-23 DIAGNOSIS — O359XX0 Maternal care for (suspected) fetal abnormality and damage, unspecified, not applicable or unspecified: Secondary | ICD-10-CM

## 2016-03-23 DIAGNOSIS — Z3A36 36 weeks gestation of pregnancy: Secondary | ICD-10-CM | POA: Insufficient documentation

## 2016-08-05 ENCOUNTER — Encounter (HOSPITAL_COMMUNITY): Payer: Self-pay

## 2017-02-02 IMAGING — US US MFM OB FOLLOW-UP
1 series · 14 of 28 positions shown · non-contrast
Comparison: none

1  FUWENG [id number]            [id number]      [id number]     [id number]
Indications

Fetal abnormality - other known or
suspected (heart defect)
23 weeks gestation of pregnancy
OB History
Gravidity:    3         Term:   1        Prem:   0        SAB:   0
TOP:          1       Ectopic:  0        Living: 1
Fetal Evaluation
Num Of Fetuses:     1
Fetal Heart         154
Rate(bpm):
Cardiac Activity:   Observed
Presentation:       Cephalic
Placenta:           Anterior, above cervical os
P. Cord Insertion:  Previously Visualized
Amniotic Fluid
AFI FV:      Subjectively within normal limits
Larg Pckt:     4.2  cm
Biometry
BPD:      62.4  mm     G. Age:  25w 2d                  CI:        74.82   %    70 - 86
FL/HC:      19.4   %    18.7 -
HC:      228.9  mm     G. Age:  24w 6d         82  %    HC/AC:      1.13        1.05 -
AC:       202   mm     G. Age:  24w 6d         79  %    FL/BPD:     71.3   %    71 - 87
FL:       44.5  mm     G. Age:  24w 5d         73  %    FL/AC:      22.0   %    20 - 24
HUM:      39.9  mm     G. Age:  24w 2d         59  %
LV:        5.5  mm
Est. FW:     736  gm    1 lb 10 oz      74  %
Gestational Age
LMP:           23w 4d        Date:  07/15/15                 EDD:   04/20/16
U/S Today:     25w 0d                                        EDD:   04/10/16
Best:          23w 4d     Det. By:  LMP  (07/15/15)          EDD:   04/20/16
Anatomy
Cranium:          Appears normal         Aortic Arch:      Abnormal, see
comments
Fetal Cavum:      Appears normal         Ductal Arch:      Abnormal, see
Ventricles:       Appears normal         Diaphragm:        Previously seen
Choroid Plexus:   Appears normal         Stomach:          Appears normal, left
sided
Cerebellum:       Appears normal         Abdomen:          Appears normal
Posterior Fossa:  Appears normal         Abdominal Wall:   Appears nml (cord
insert, abd wall)
Nuchal Fold:      Previously seen        Cord Vessels:     Appears normal (3
vessel cord)
Face:             Appears normal         Kidneys:          Appear normal
(orbits and profile)
Lips:             Appears normal         Bladder:          Appears normal
Fetal Thoracic:   Previously seen        Spine:            Previously seen
Heart:            Abnormal, see          Upper             Previously seen
Extremities:
RVOT:             Abnormal, see          Lower             Previously seen
LVOT:             Abnormal, see
Other:  Female gender. Heels and 5th digit previously seen.
Cervix Uterus Adnexa
Cervix
Length:           3.02  cm.
Normal appearance by transabdominal scan. Appears closed, without
funnelling.
Impression
INDICATION: 29 yr old XX9GAGG at 21w9d wtih fetus with
tetralogy of [place] for fetal growth.

[Series 1: us mfm ob follow-up · 54 acquisitions, 14 frames shown]
[im 2/54]
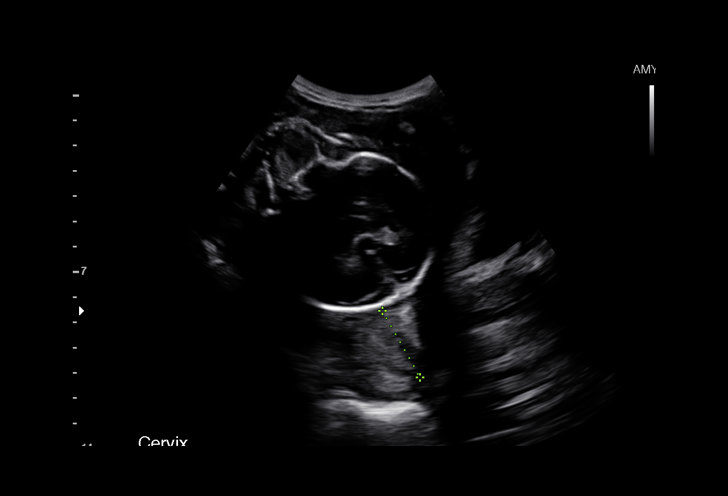
[im 6/54]
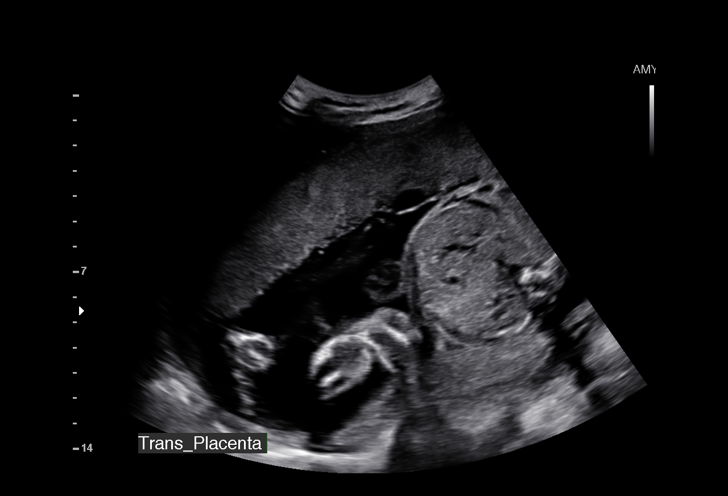
[im 10/54]
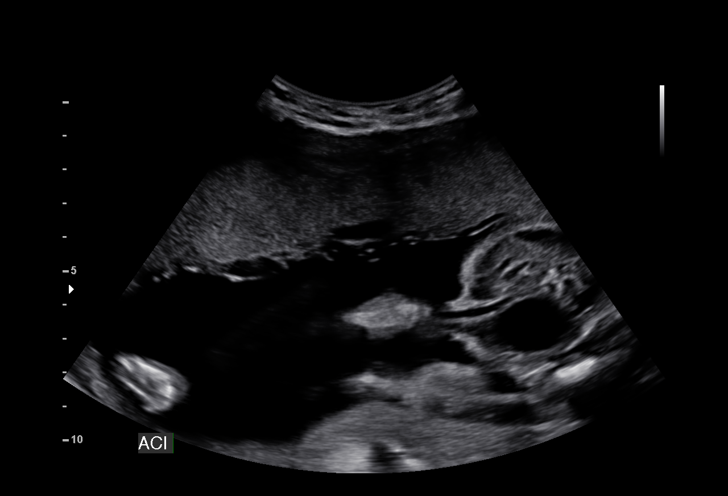
[im 14/54]
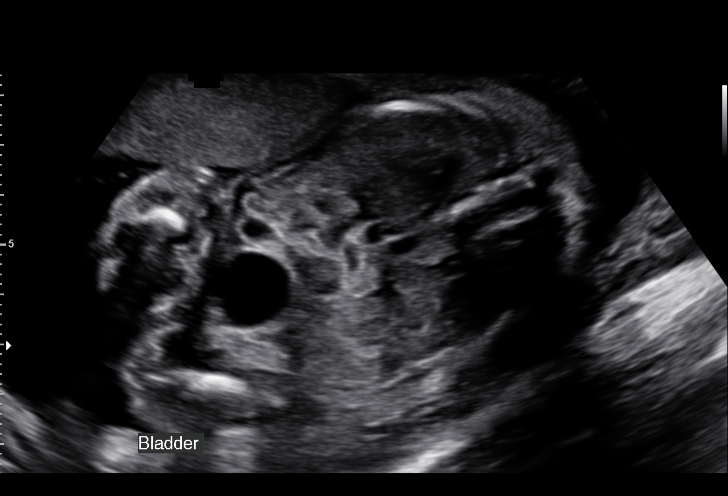
[im 18/54]
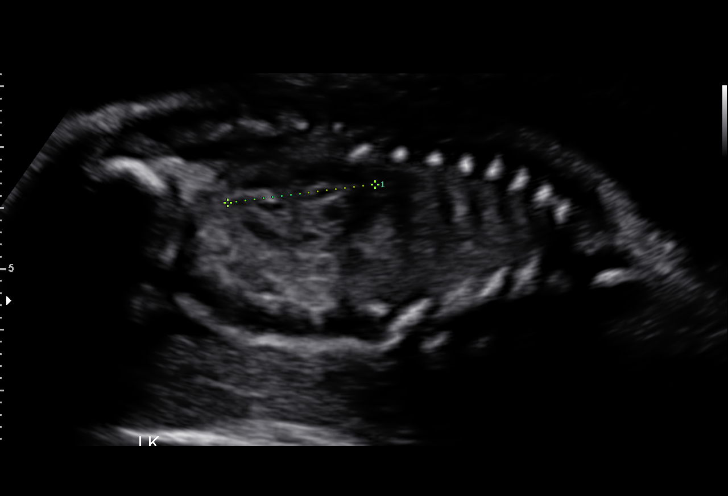
[im 22/54]
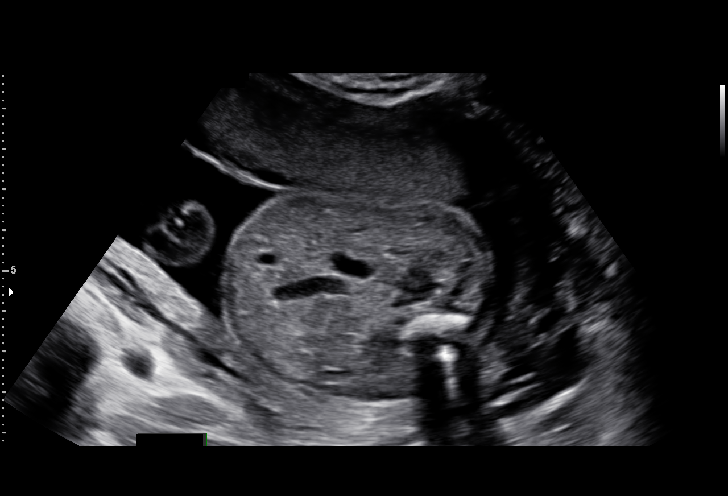
[im 26/54]
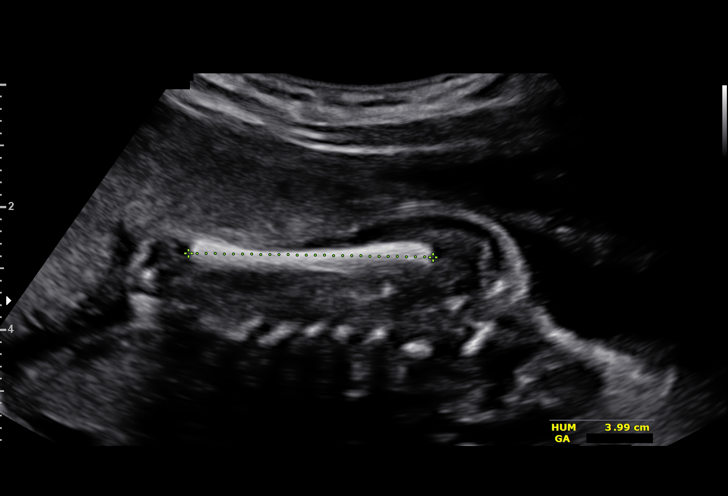
[im 30/54]
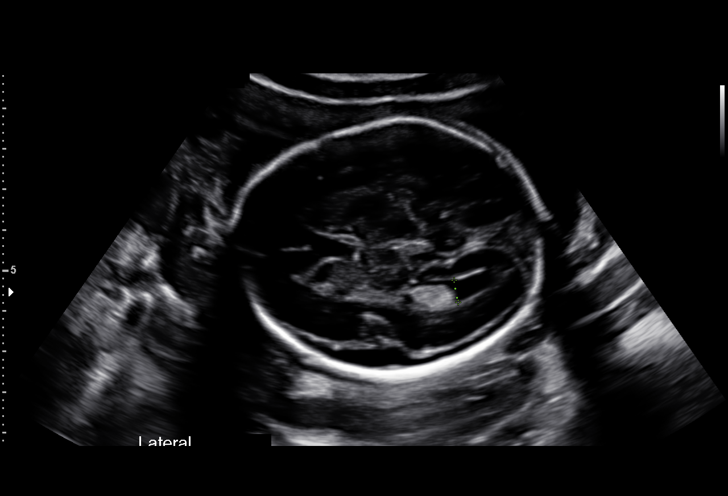
[im 34/54]
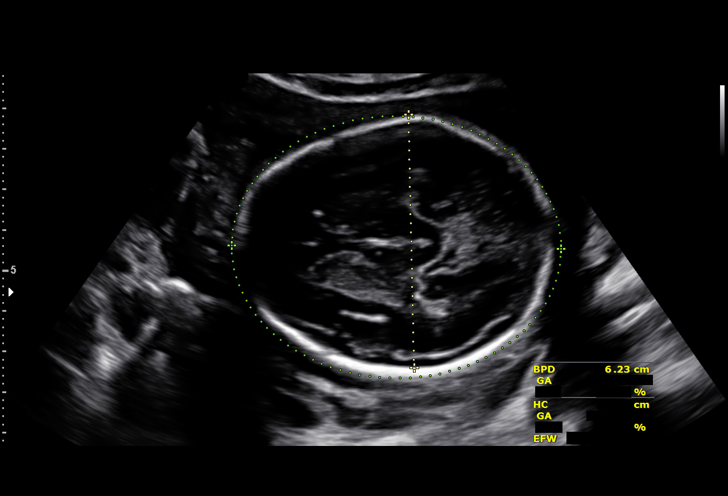
[im 38/54]
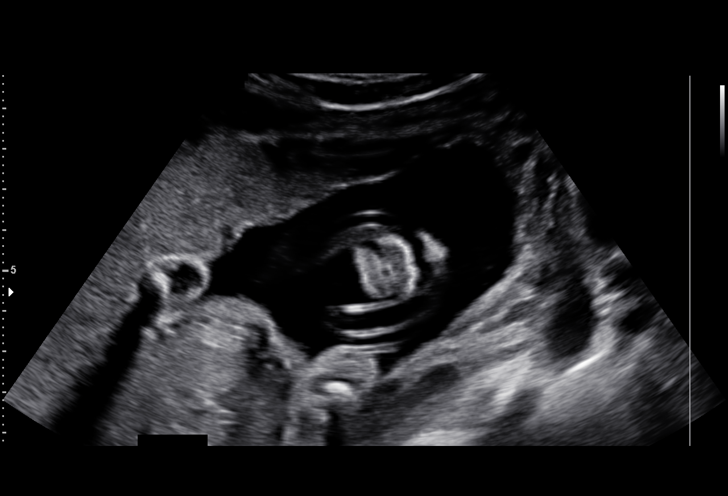
[im 42/54]
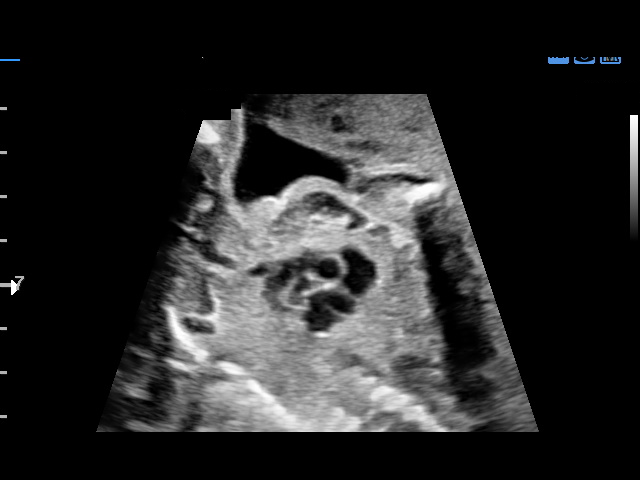
[im 46/54]
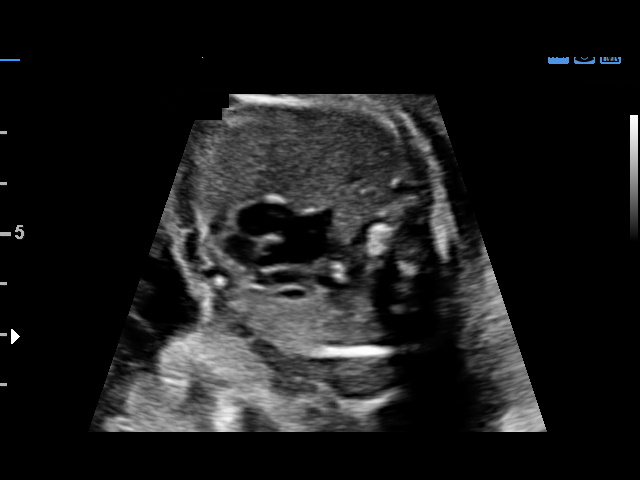
[im 50/54]
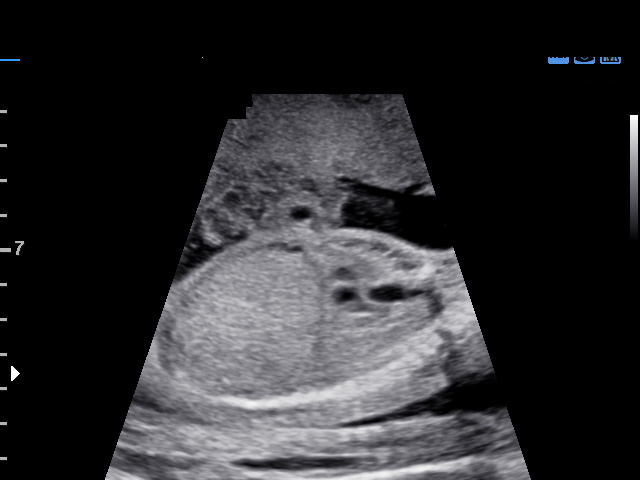
[im 54/54]
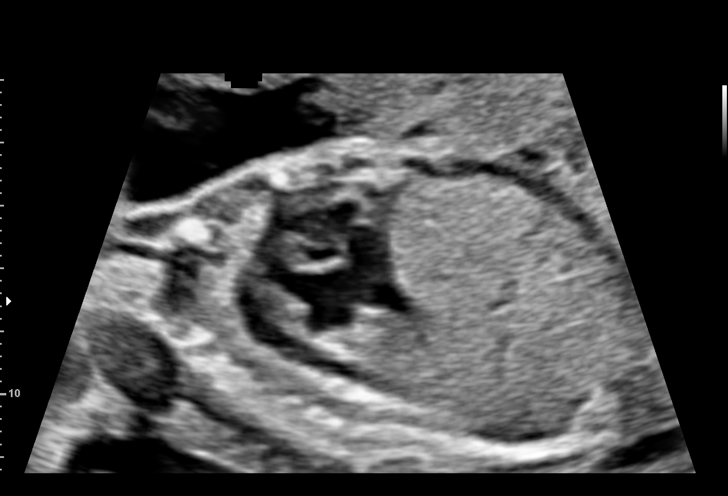

[14 of 28 positions shown; findings below may reference images not displayed]

FINDINGS: 1. Single intrauterine pregnancy.
2. Estimated fetal weight is in the 74th%.
3. Anterior placenta without evidence of previa.
4. Normal amniotic fluid volume.
5. Normal transabdominal cervical length.
6. Again the heart views are abnormal.
7. The remainder of the limited anatomy survey is normal.
Recommendations

1. Appropriate fetal growth.
2. Fetal cardiac anomaly:
- previously counseled
- followed by Pediatric Cardiology
- declined genetic testing
- recommend fetal growth every 4 weeks

## 2017-03-01 IMAGING — US US MFM OB FOLLOW-UP
1 series · 14 of 28 positions shown · non-contrast
Comparison: none

[Series 1: us mfm ob follow-up · 55 acquisitions, 14 frames shown]
[im 3/55]
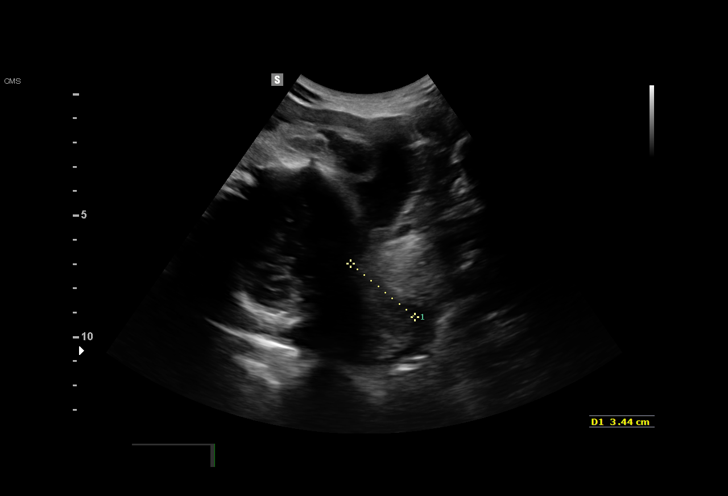
[im 7/55]
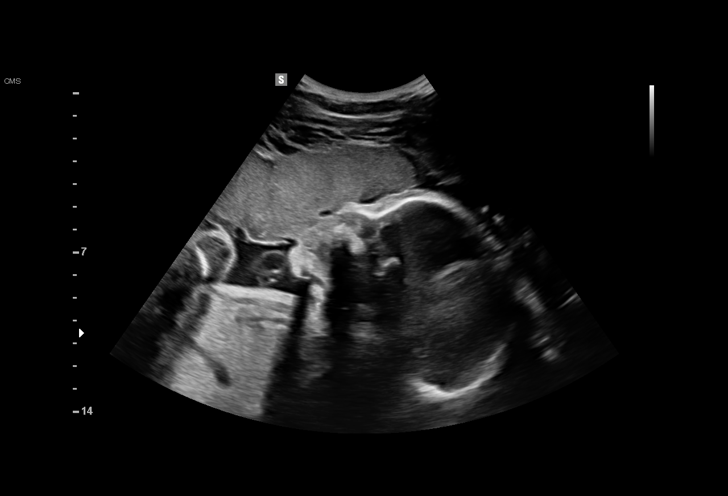
[im 11/55]
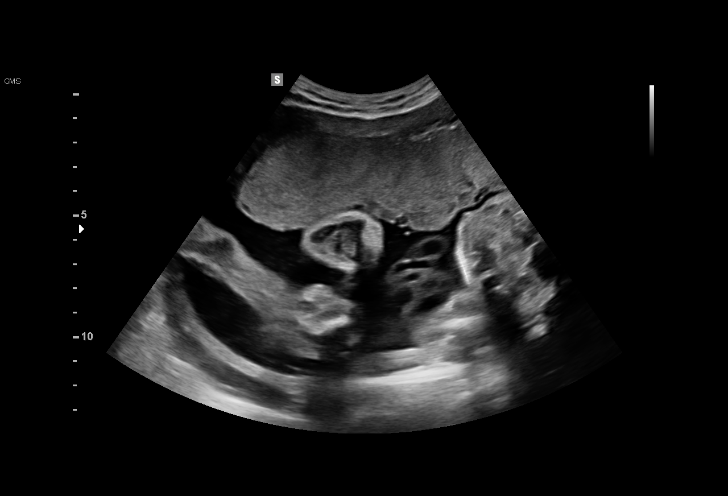
[im 15/55]
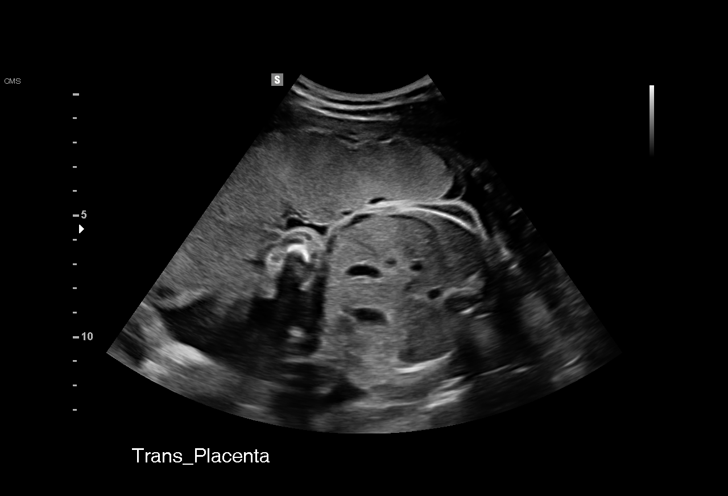
[im 19/55]
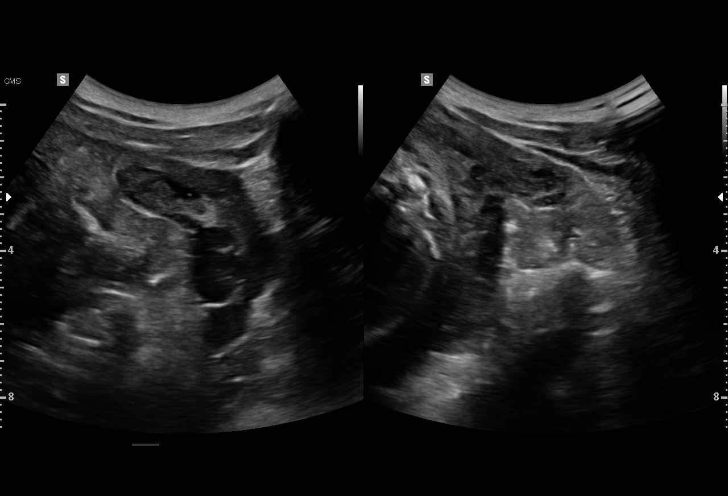
[im 23/55]
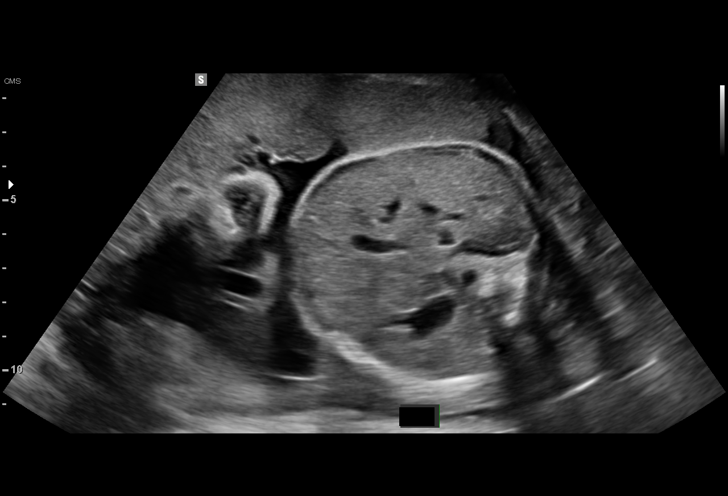
[im 27/55]
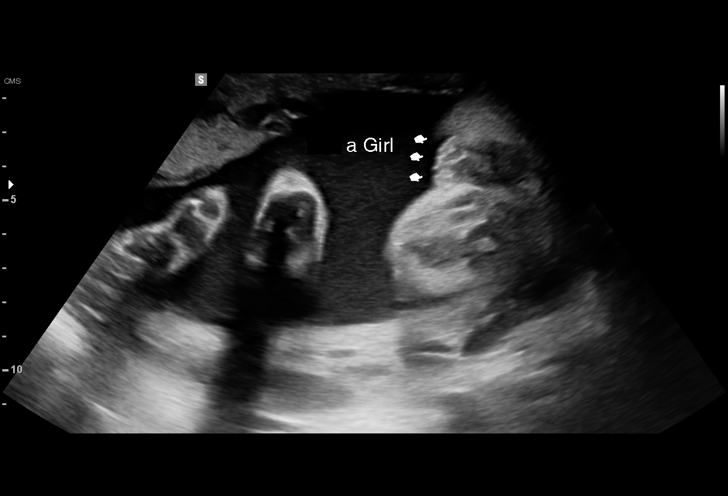
[im 31/55]
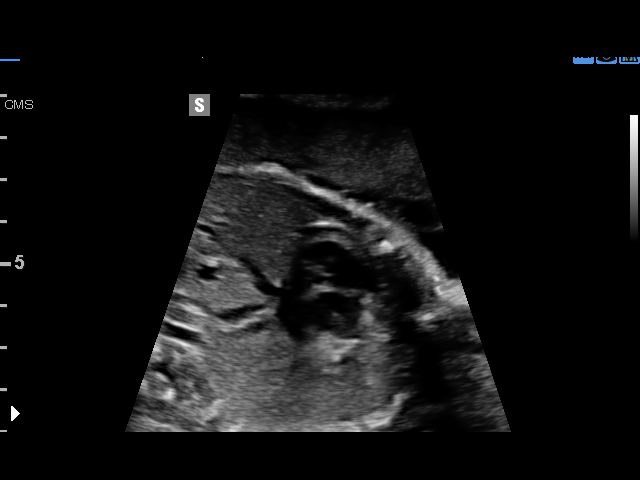
[im 35/55]
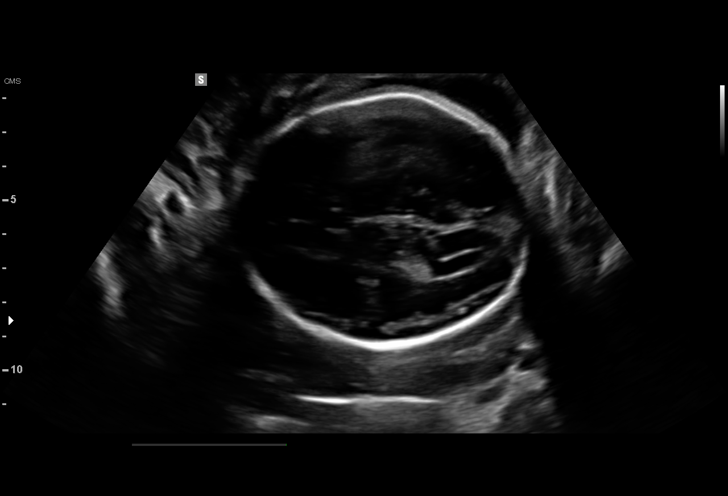
[im 39/55]
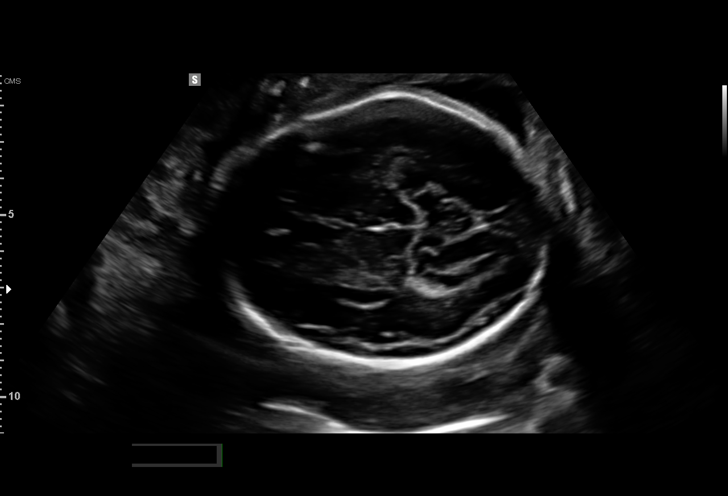
[im 43/55]
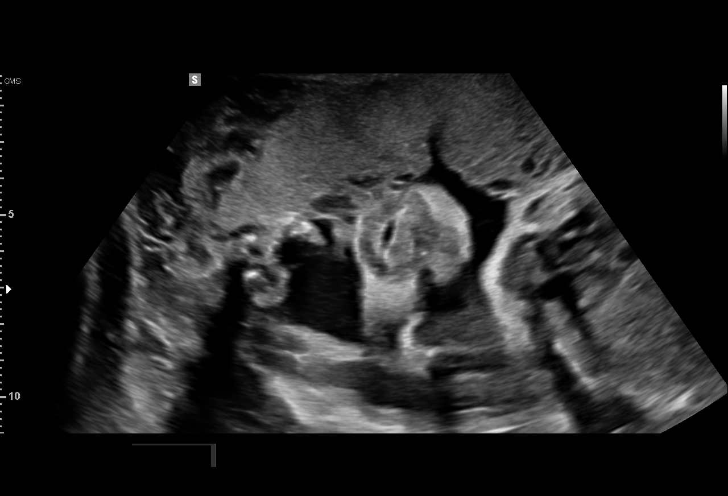
[im 47/55]
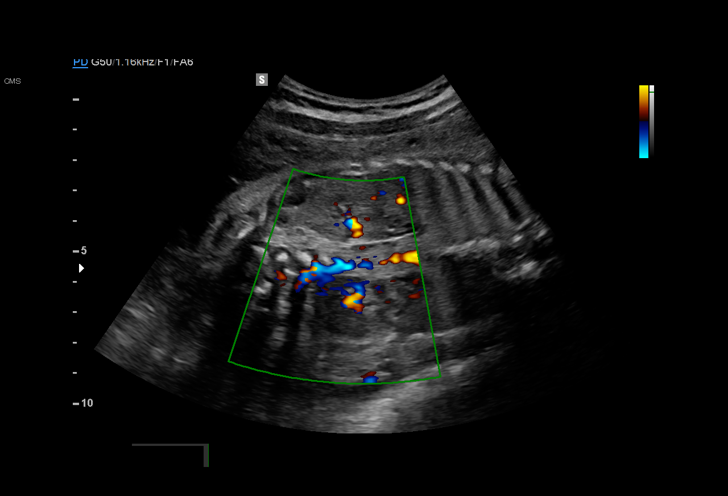
[im 51/55]
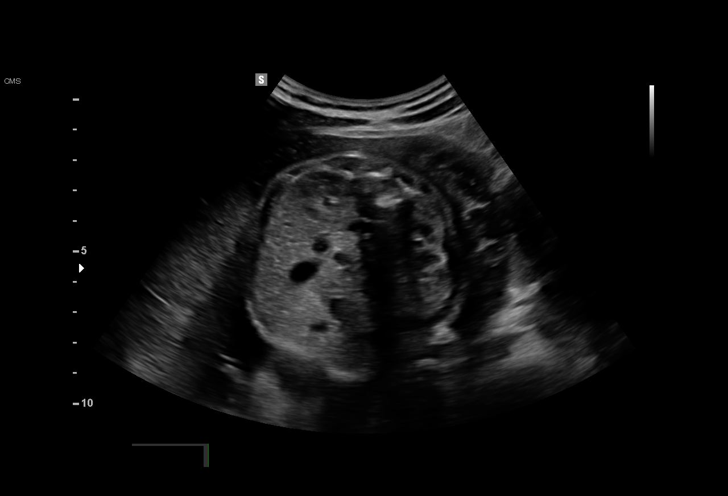
[im 55/55]
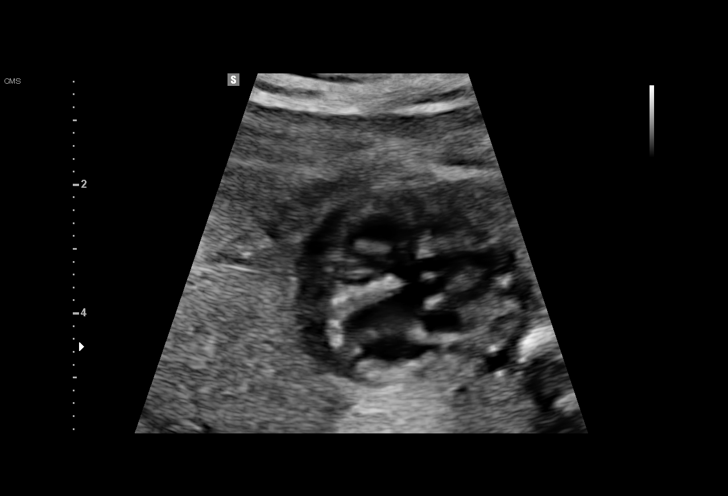

[14 of 28 positions shown; findings below may reference images not displayed]

1  KLEVER JUMPER            [id number]      [id number]     [id number]
Indications

Fetal abnormality - other known or
suspected (heart - TOF)
27 weeks gestation of pregnancy
OB History

Gravidity:    3         Term:   1        Prem:   0        SAB:   0
TOP:          1       Ectopic:  0        Living: 1
Fetal Evaluation

Num Of Fetuses:     1
Fetal Heart         164
Rate(bpm):
Cardiac Activity:   Observed
Presentation:       Cephalic
Placenta:           Anterior, above cervical os
P. Cord Insertion:  Visualized, central

Amniotic Fluid
AFI FV:      Subjectively within normal limits
Larg Pckt:   4.59   cm
Biometry

BPD:      73.6  mm     G. Age:  29w 4d                  CI:        73.53   %   70 - 86
FL/HC:      19.4   %   18.6 -
HC:      272.7  mm     G. Age:  29w 5d         89  %    HC/AC:      1.10       1.05 -
AC:      248.5  mm     G. Age:  29w 0d         86  %    FL/BPD:     71.7   %   71 - 87
FL:       52.8  mm     G. Age:  28w 1d         55  %    FL/AC:      21.2   %   20 - 24
HUM:      45.7  mm     G. Age:  27w 0d         37  %
Est. FW:    6207  gm    2 lb 14 oz      77  %
Gestational Age

LMP:           27w 3d       Date:   07/15/15                 EDD:   04/20/16
U/S Today:     29w 1d                                        EDD:   04/08/16
Best:          27w 3d    Det. By:   LMP  (07/15/15)          EDD:   04/20/16
Anatomy

Cranium:          Appears normal         Aortic Arch:      Abnormal, see
comments
Fetal Cavum:      Appears normal         Ductal Arch:      Abnormal, see
comments
Ventricles:       Appears normal         Diaphragm:        Appears normal
Choroid Plexus:   Appears normal         Stomach:          Appears normal, left
sided
Cerebellum:       Appears normal         Abdomen:          Appears normal
Posterior Fossa:  Appears normal         Abdominal Wall:   Appears nml (cord
insert, abd wall)
Nuchal Fold:      Previously seen        Cord Vessels:     Appears normal (3
vessel cord)
Face:             Appears normal         Kidneys:          Appear normal
(orbits and profile)
Lips:             Appears normal         Bladder:          Appears normal
Fetal Thoracic:   Previously seen        Spine:            Previously seen
Appears normal
Heart:            Abnormal, see          Upper             Previously seen
comments
Extremities:
RVOT:             Abnormal, see          Lower             Previously seen
comments
Extremities:
LVOT:             Abnormal, see
comments

Other:  Female gender. Heels and 5th digit previously seen.
Cervix Uterus Adnexa

Cervix
Length:            3.4  cm.
Normal appearance by transabdominal scan.

Uterus
No abnormality visualized.

Left Ovary
Within normal limits.

Right Ovary
Within normal limits.

Cul De Sac:   No free fluid seen.

Adnexa:       No abnormality visualized.
Impression

Single IUP at 27w 3d
Follow up due to fetus with [facility] - declined
amniocentesis / cell free fetal DNA
Fetal growth is appropriate (77th %tile)
Anterior placenta without previa
Normal amniotic fluid volume
Recommendations

Recommend follow up ultrasound in 4 weeks for interval
growth
Plan transfer of care to [facility] 
 32-36 weeks gestation with
delivery at [facility] [HOSPITAL]

## 2017-04-30 IMAGING — US US MFM OB FOLLOW-UP
1 series · 14 of 28 positions shown · non-contrast
Comparison: none

[Series 1: us mfm ob follow-up · 43 acquisitions, 14 frames shown]
[im 2/43]
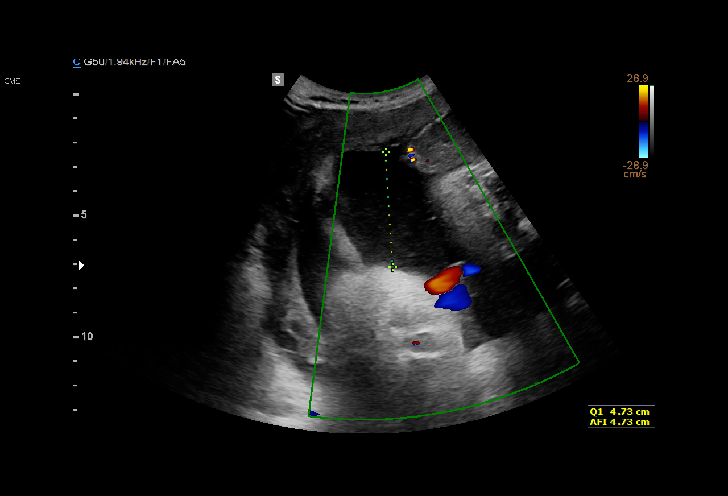
[im 5/43]
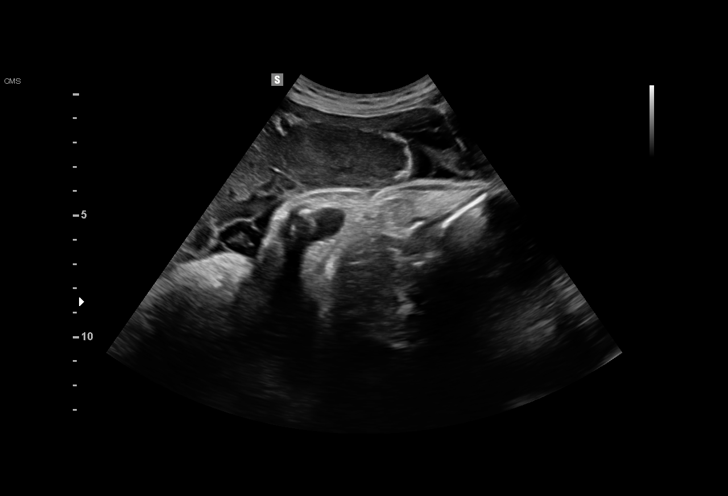
[im 8/43]
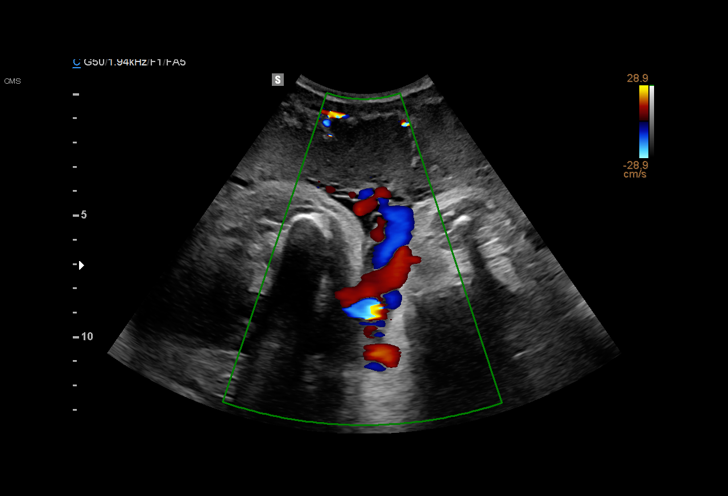
[im 11/43]
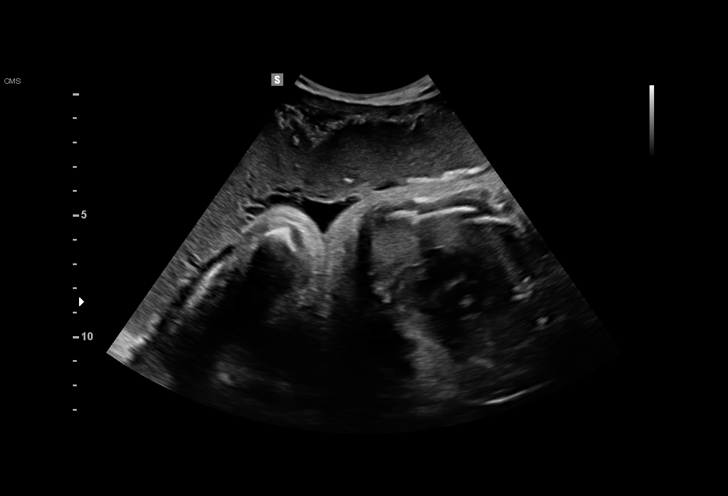
[im 15/43]
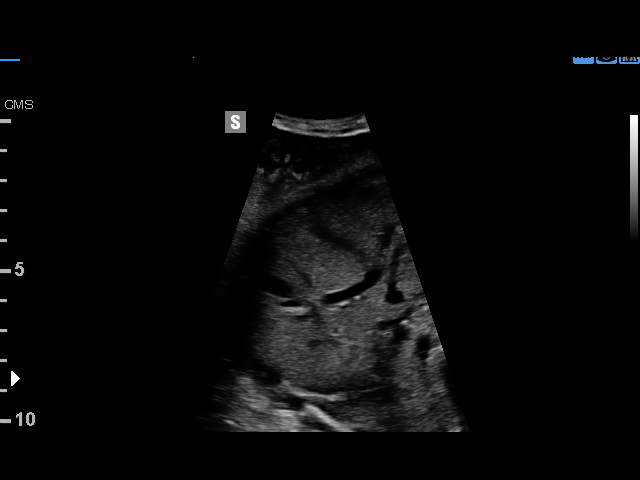
[im 18/43]
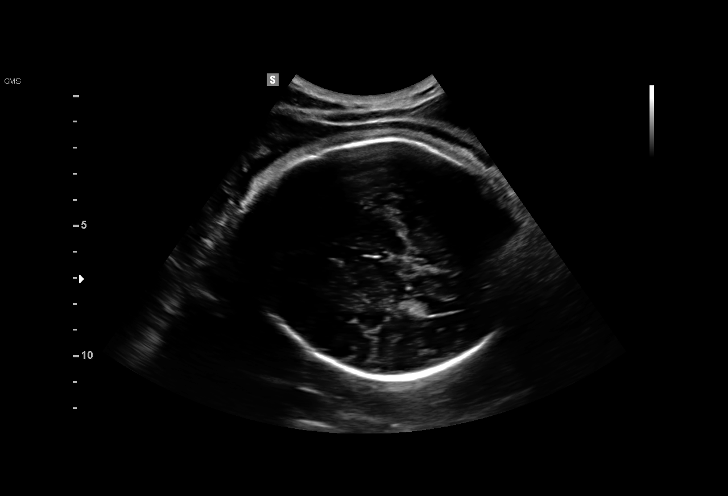
[im 21/43]
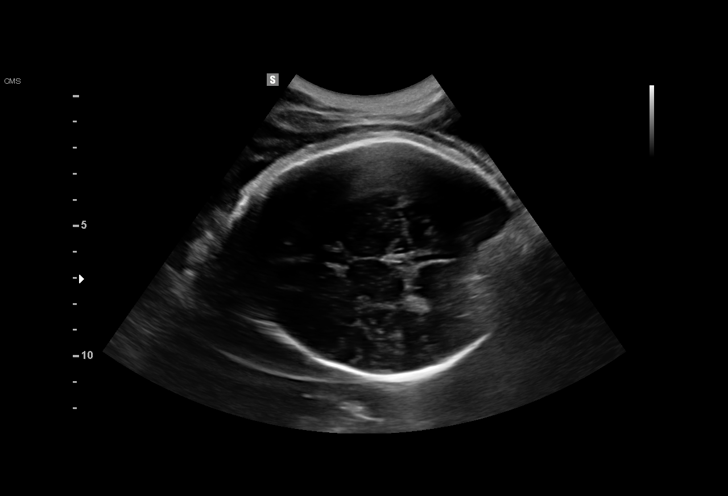
[im 24/43]
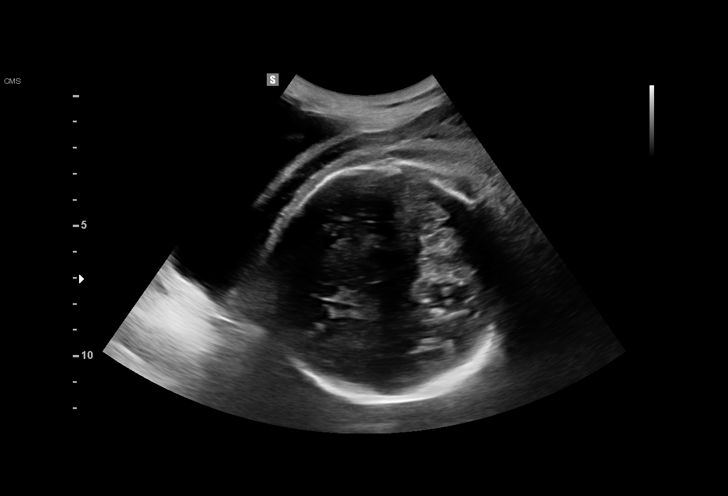
[im 27/43]
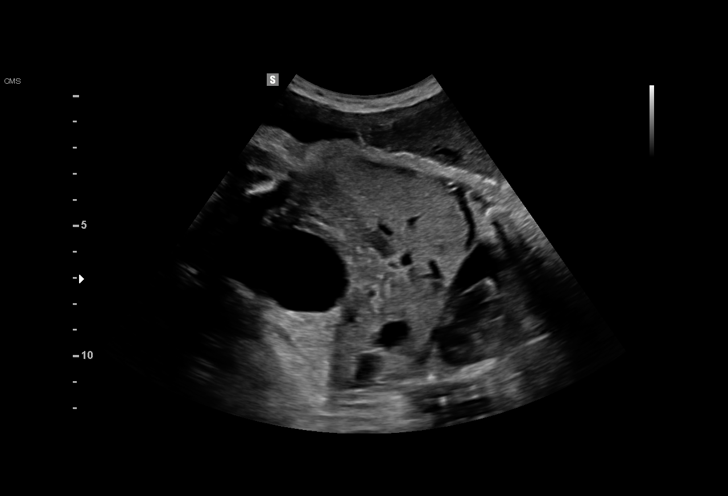
[im 30/43]
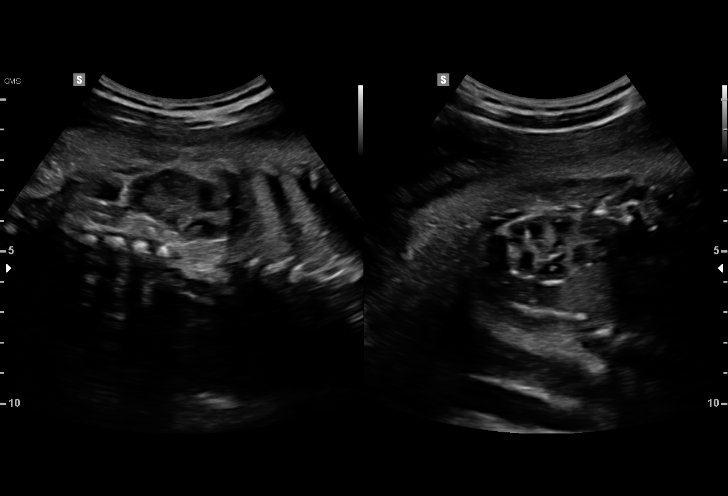
[im 33/43]
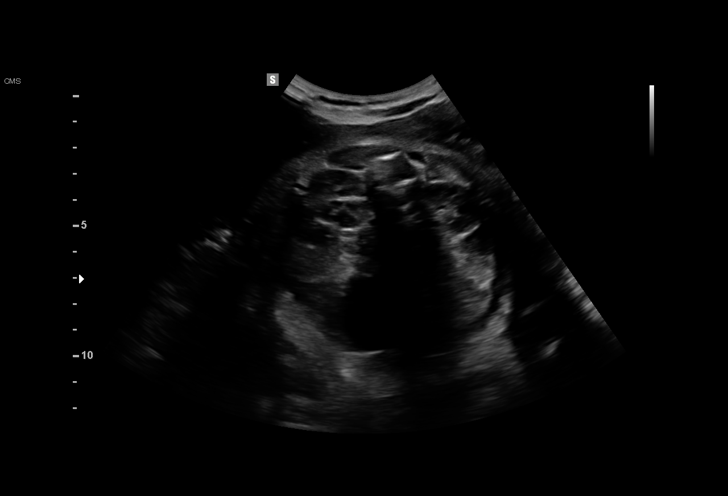
[im 36/43]
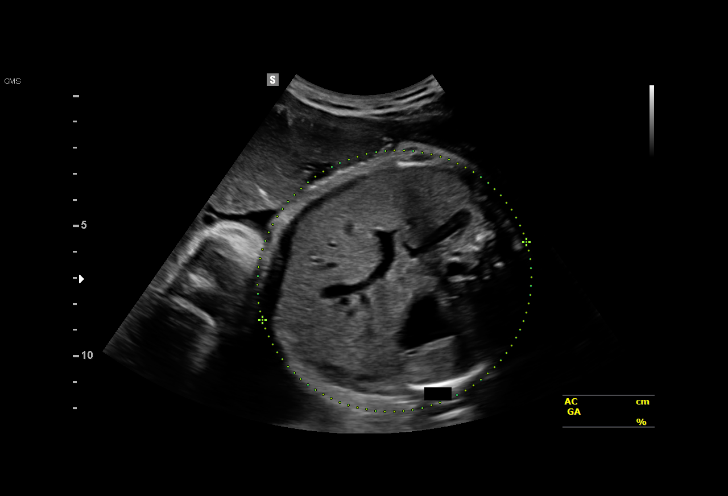
[im 39/43]
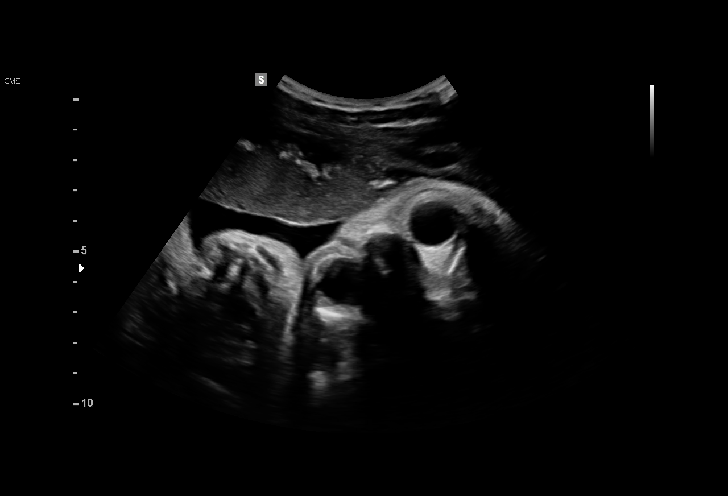
[im 43/43]
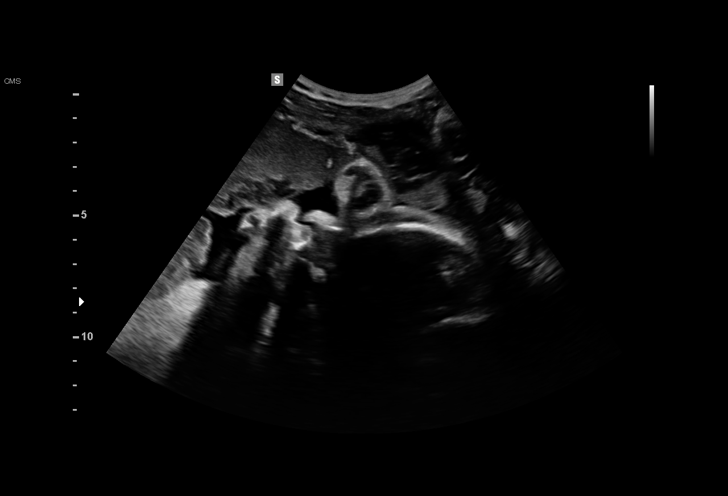

[14 of 28 positions shown; findings below may reference images not displayed]

1  PROFA YANID GODREAU            [id number]      [id number]     [id number]
Indications

36 weeks gestation of pregnancy
Fetal abnormality - other known or
suspected (heart - TOF)
OB History

Gravidity:    3         Term:   1        Prem:   0        SAB:   0
TOP:          1       Ectopic:  0        Living: 1
Fetal Evaluation

Num Of Fetuses:     1
Fetal Heart         158
Rate(bpm):
Cardiac Activity:   Observed
Presentation:       Cephalic
Placenta:           Anterior, above cervical os
P. Cord Insertion:  Visualized, central

Amniotic Fluid
AFI FV:      Subjectively within normal limits

AFI Sum(cm)     %Tile       Largest Pocket(cm)
9.54            19

RUQ(cm)                     LUQ(cm)        LLQ(cm)
4.73
Biometry
BPD:        91  mm     G. Age:  36w 6d         81  %    CI:        73.18   %   70 - 86
FL/HC:      20.9   %   20.1 -
HC:      338.1  mm     G. Age:  38w 6d         84  %    HC/AC:      1.06       0.93 -
AC:      320.3  mm     G. Age:  36w 0d         58  %    FL/BPD:     77.5   %   71 - 87
FL:       70.5  mm     G. Age:  36w 1d         49  %    FL/AC:      22.0   %   20 - 24
HUM:      57.9  mm     G. Age:  33w 4d         22  %

Est. FW:    9514  gm      6 lb 7 oz     73  %
Gestational Age

LMP:           36w 0d       Date:   07/15/15                 EDD:   04/20/16
U/S Today:     37w 0d                                        EDD:   04/13/16
Best:          36w 0d    Det. By:   LMP  (07/15/15)          EDD:   04/20/16
Anatomy

Cranium:               Appears normal         Aortic Arch:            Abnormal, see
comments
Cavum:                 Previously seen        Ductal Arch:            Abnormal, see
comments
Ventricles:            Appears normal         Diaphragm:              Appears normal
Choroid Plexus:        Appears normal         Stomach:                Appears normal, left
sided
Cerebellum:            Appears normal         Abdomen:                Appears normal
Posterior Fossa:       Previously seen        Abdominal Wall:         Previously seen
Nuchal Fold:           Previously seen        Cord Vessels:           Previously seen
Face:                  Orbits and profile     Kidneys:                Appear normal
previously seen
Lips:                  Appears normal         Bladder:                Appears normal
Thoracic:              Appears normal         Spine:                  Previously seen
Heart:                 Abnormal, see          Upper Extremities:      Previously seen
comments
RVOT:                  Abnormal, see          Lower Extremities:      Previously seen
comments
LVOT:                  Abnormal, see
comments

Other:  Female gender previously visualized. Heels and 5th digit previously
seen.
Cervix Uterus Adnexa

Cervix
Not visualized (advanced GA >07wks)

Uterus
No abnormality visualized.

Left Ovary
Not visualized.

Right Ovary
Not visualized.

Cul De Sac:   No free fluid seen.

Adnexa:       No abnormality visualized.
Impression

Single IUP at 36w 0d
Fetus with [facility] - prenatal care now at [facility]
Fetal growth is appropriate (73rd %tile)
Anterior placenta without previa
Normal amniotic fluid volume
Recommendations

Continue prenatal care at [facility]
Plan delivery at 
 39 weeks
